# Patient Record
Sex: Male | Born: 2000 | Race: Black or African American | Hispanic: No | Marital: Single | Smoking: Never smoker
Health system: Southern US, Community
[De-identification: ages and names within clinical notes are randomized; demographics above are authoritative.]

## PROBLEM LIST (undated history)

## (undated) DIAGNOSIS — J309 Allergic rhinitis, unspecified: Secondary | ICD-10-CM

## (undated) HISTORY — DX: Allergic rhinitis, unspecified: J30.9

---
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
## 2021-01-28 IMAGING — DX DG ANKLE COMPLETE 3+V*L*
3 series · 3 of 3 positions shown · non-contrast
Comparison: 09/10/2018

CLINICAL DATA: Ankle swelling and pain, basketball injury

EXAM:
LEFT ANKLE COMPLETE - 3+ VIEW

[ankle ap]
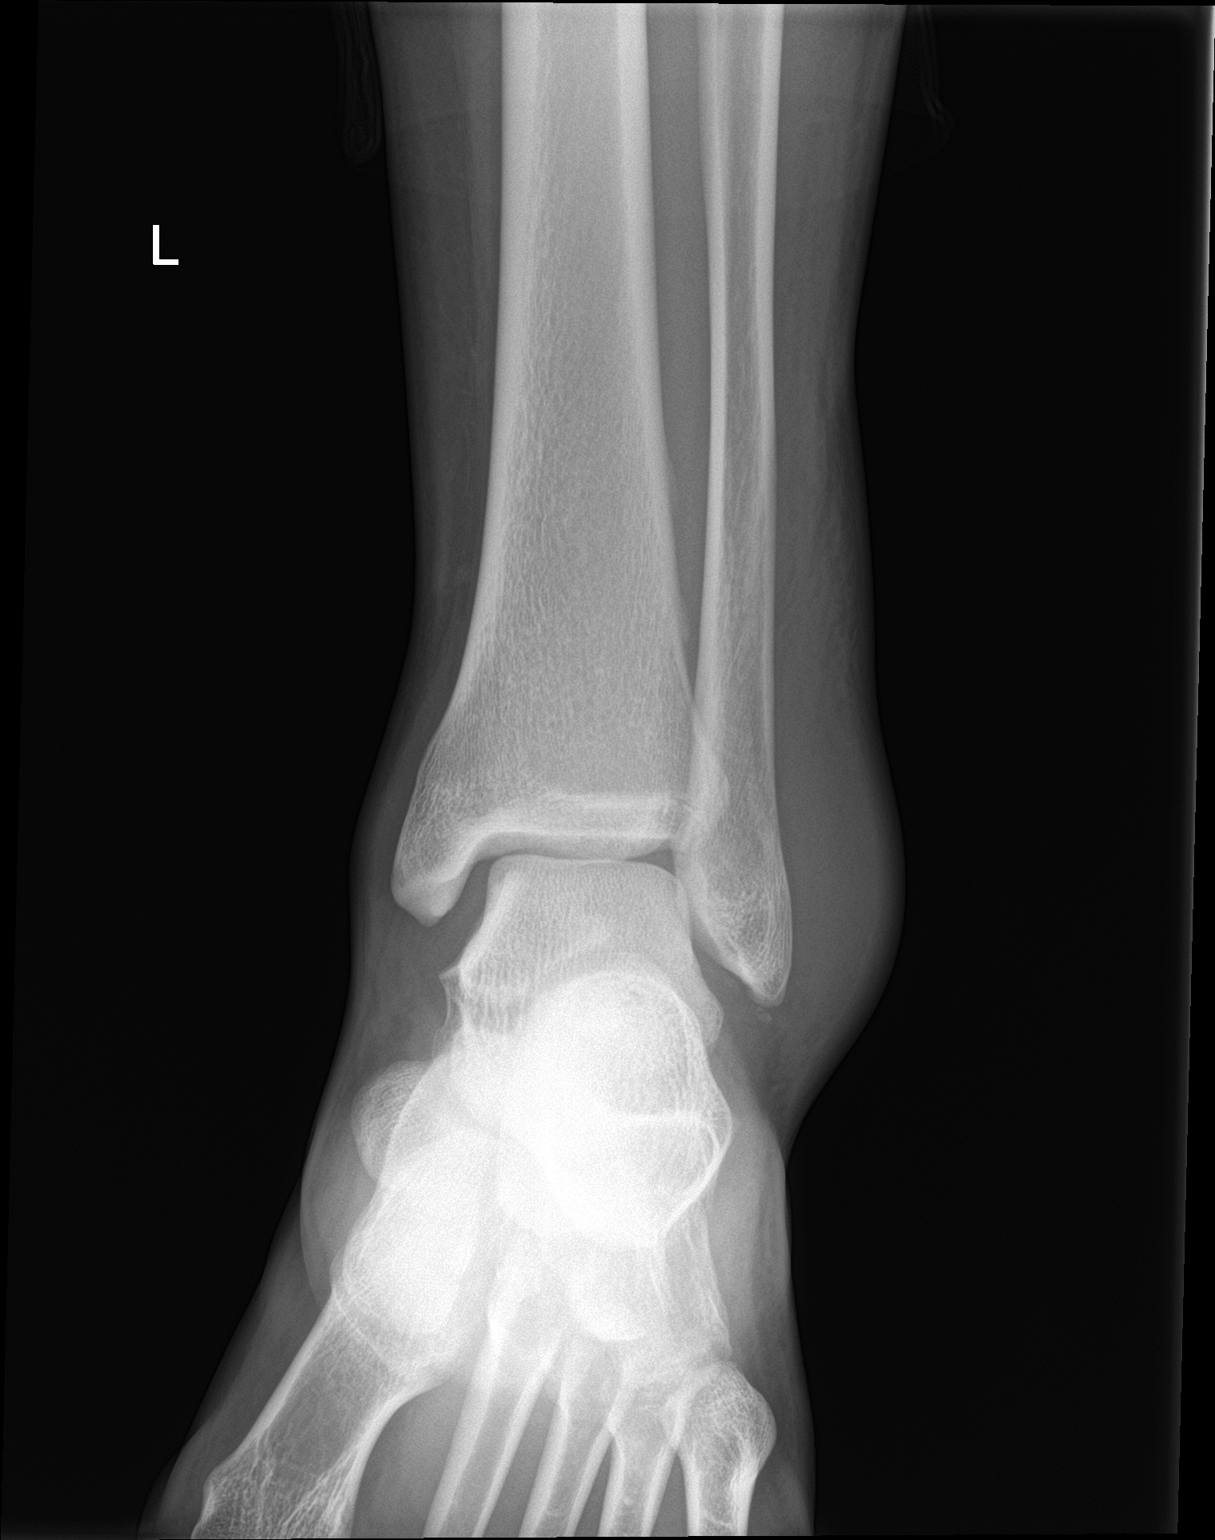

[ankle obl]
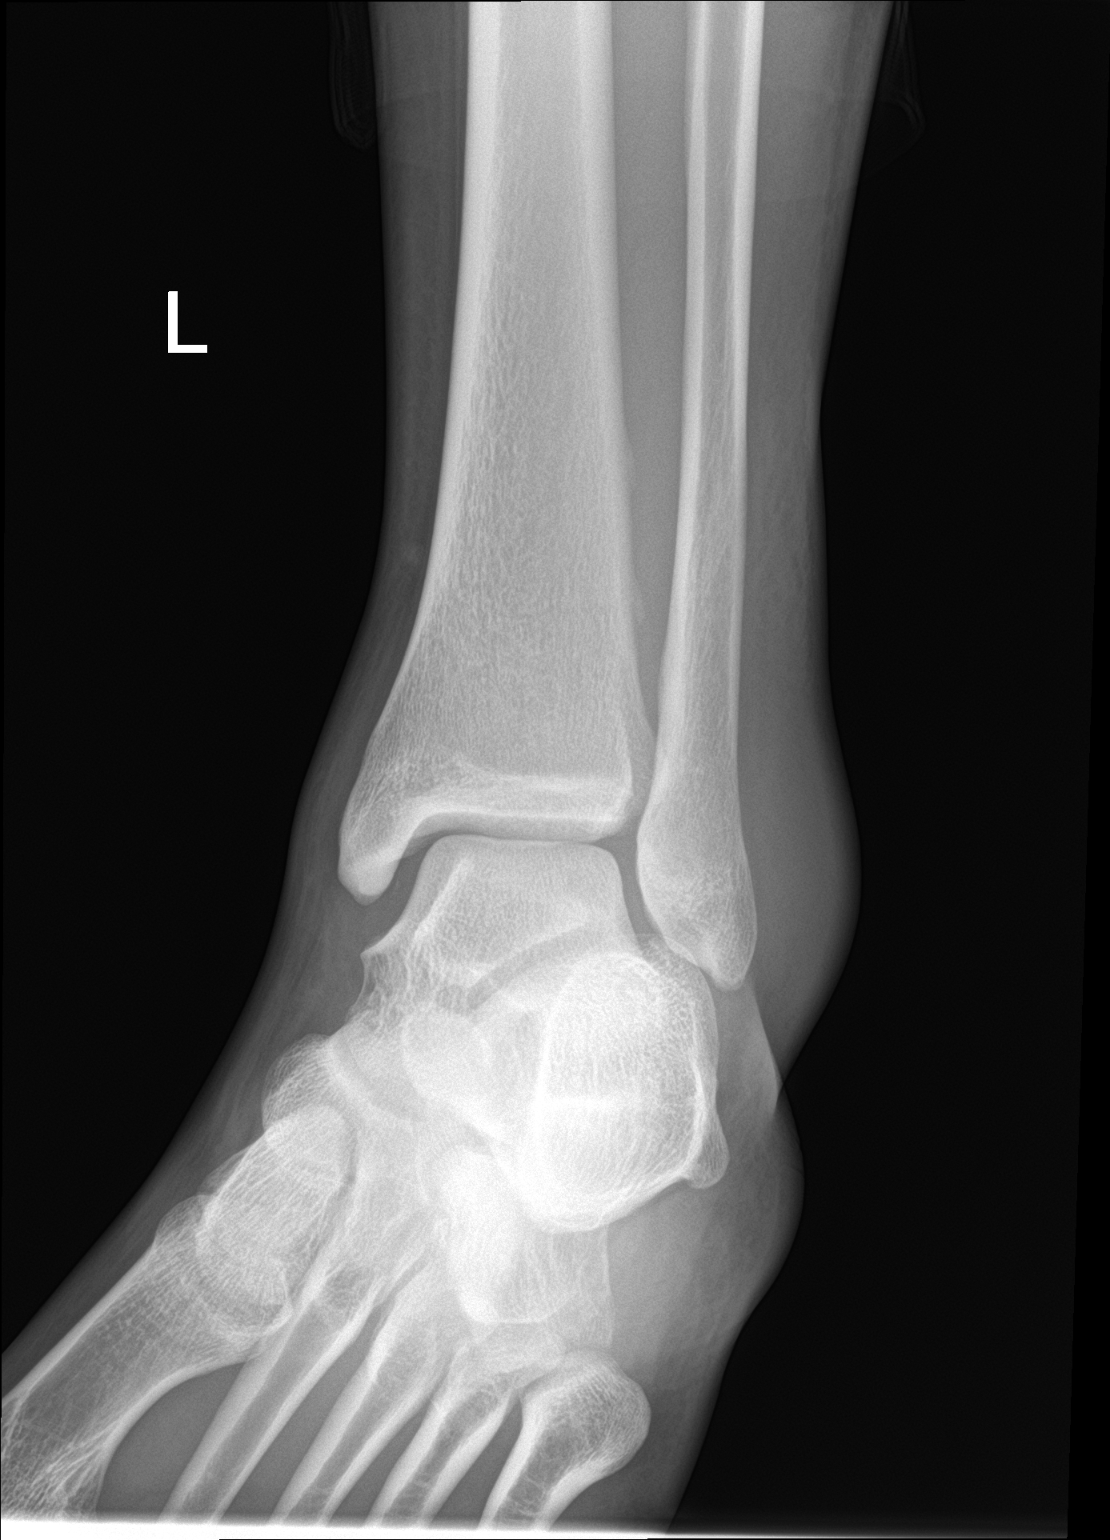

[ankle lat]
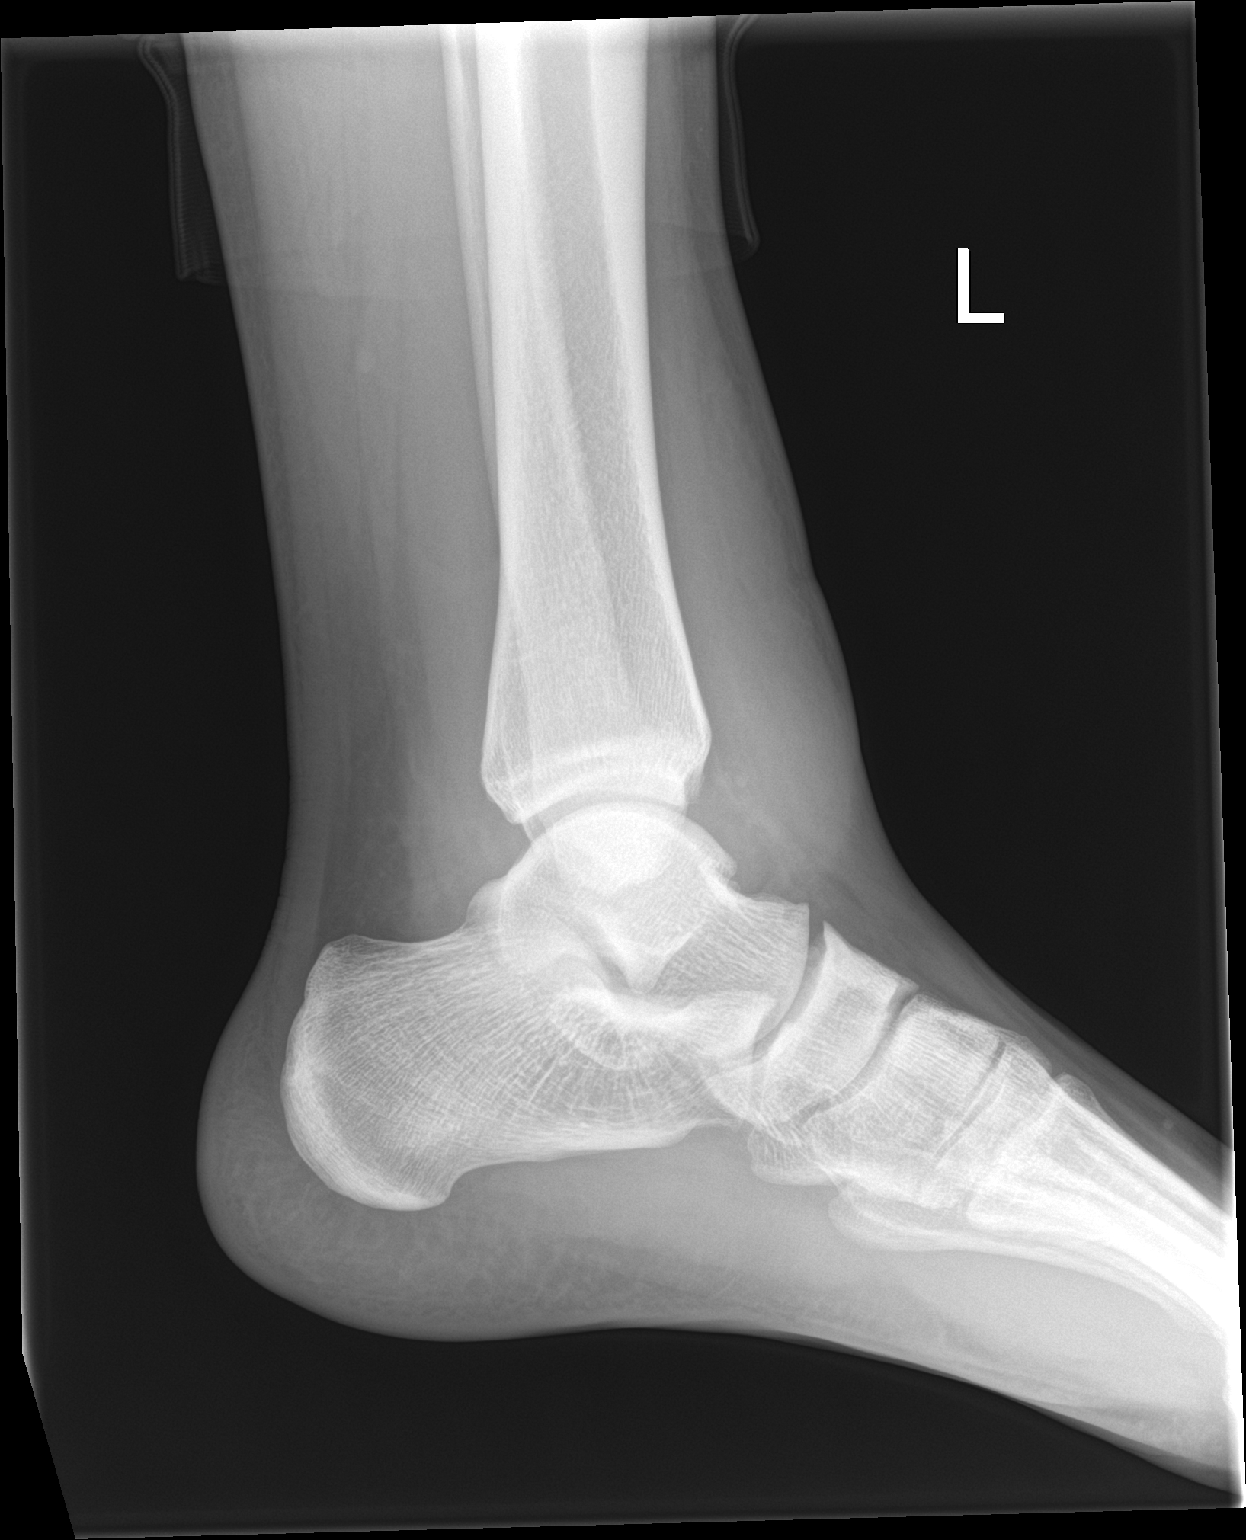

[3 of 3 positions shown; findings below may reference images not displayed]

FINDINGS: Frontal, oblique, and lateral views of the left ankle are obtained.
There is severe anterior and lateral soft tissue edema. No acute
fracture, subluxation, or dislocation. Ankle mortise is intact.
IMPRESSION: 1. Anterolateral soft tissue edema.  No acute bony abnormality.

## 2021-02-23 ENCOUNTER — Telehealth: Payer: Self-pay

## 2021-02-23 NOTE — Telephone Encounter (Signed)
Mother has requested copy of shot records for school  Pt call back [phone]

## 2021-02-24 NOTE — Telephone Encounter (Signed)
Shot record printed and mom is aware. Shot record placed up front for pick up.

## 2021-03-09 ENCOUNTER — Encounter: Payer: Self-pay | Admitting: Family Medicine

## 2021-03-09 ENCOUNTER — Ambulatory Visit: Payer: BC Managed Care – PPO | Admitting: Family Medicine

## 2021-03-09 ENCOUNTER — Other Ambulatory Visit: Payer: Self-pay

## 2021-03-09 VITALS — BP 120/82 | HR 66 | Temp 98.1°F | Wt 218.8 lb

## 2021-03-09 DIAGNOSIS — L209 Atopic dermatitis, unspecified: Secondary | ICD-10-CM

## 2021-03-09 MED ORDER — TRIAMCINOLONE ACETONIDE 0.1 % EX CREA: 1.0000 "application " | TOPICAL_CREAM | Freq: Two times a day (BID) | CUTANEOUS | 2 refills | Status: DC

## 2021-03-09 NOTE — Patient Instructions (Signed)
Atopic Dermatitis Atopic dermatitis is a skin disorder that causes inflammation of the skin. It is marked by a red rash and itchy, dry, scaly skin. It is the most common type of eczema. Eczema is a group of skin conditions that cause the skin to become rough and swollen. This condition is generally worse during the cooler winter months and often improves during the warm summer months. Atopic dermatitis usually starts showing signs in infancy and can last through adulthood. This condition cannot be passed from one person to another (is not contagious). Atopic dermatitis may not always be present, but when it is, it is called a flare-up. What are the causes? The exact cause of this condition is not known. Flare-ups may be triggered by:  Coming in contact with something that you are sensitive or allergic to (allergen).  Stress.  Certain foods.  Extremely hot or cold weather.  Harsh chemicals and soaps.  Dry air.  Chlorine. What increases the risk? This condition is more likely to develop in people who have a personal or family history of:  Eczema.  Allergies.  Asthma.  Hay fever. What are the signs or symptoms? Symptoms of this condition include:  Dry, scaly skin.  Red, itchy rash.  Itchiness, which can be severe. This may occur before the skin rash. This can make sleeping difficult.  Skin thickening and cracking that can occur over time.   How is this diagnosed? This condition is diagnosed based on:  Your symptoms.  Your medical history.  A physical exam. How is this treated? There is no cure for this condition, but symptoms can usually be controlled. Treatment focuses on:  Controlling the itchiness and scratching. You may be given medicines, such as antihistamines or steroid creams.  Limiting exposure to allergens.  Recognizing situations that cause stress and developing a plan to manage stress. If your atopic dermatitis does not get better with medicines, or if  it is all over your body (widespread), a treatment using a specific type of light (phototherapy) may be used. Follow these instructions at home: Skin care  Keep your skin well moisturized. Doing this seals in moisture and helps to prevent dryness. ? Use unscented lotions that have petroleum in them. ? Avoid lotions that contain alcohol or water. They can dry the skin.  Keep baths or showers short (less than 5 minutes) in warm water. Do not use hot water. ? Use mild, unscented cleansers for bathing. Avoid soap and bubble bath. ? Apply a moisturizer to your skin right after a bath or shower.  Do not apply anything to your skin without checking with your health care provider.   General instructions  Take or apply over-the-counter and prescription medicines only as told by your health care provider.  Dress in clothes made of cotton or cotton blends. Dress lightly because heat increases itchiness.  When washing your clothes, rinse your clothes twice so all of the soap is removed.  Avoid any triggers that can cause a flare-up.  Keep your fingernails cut short.  Avoid scratching. Scratching makes the rash and itchiness worse. A break in the skin from scratching could result in a skin infection (impetigo).  Do not be around people who have cold sores or fever blisters. If you get the infection, it may cause your atopic dermatitis to worsen.  Keep all follow-up visits. This is important. Contact a health care provider if:  Your itchiness interferes with sleep.  Your rash gets worse or is not better within   one week of starting treatment.  You have a fever.  You have a rash flare-up after having contact with someone who has cold sores or fever blisters. Get help right away if:  You develop pus or soft yellow scabs in the rash area. Summary  Atopic dermatitis causes a red rash and itchy, dry, scaly skin.  Treatment focuses on controlling the itchiness and scratching, limiting  exposure to things that you are sensitive or allergic to (allergens), recognizing situations that cause stress, and developing a plan to manage stress.  Keep your skin well moisturized.  Keep baths or showers shorter than 5 minutes and use warm water. Do not use hot water. This information is not intended to replace advice given to you by your health care provider. Make sure you discuss any questions you have with your health care provider. Document Revised: 07/27/2020 Document Reviewed: 07/27/2020 Elsevier Patient Education  2021 Elsevier Inc.  

## 2021-03-09 NOTE — Progress Notes (Signed)
Pt has rash on both elbows. Has been there a few days. No itching, burning or stinging. No pain. No treatments tried at this time.     Patient ID: Mark Hickman, male    DOB: June 06, 2001, 20 y.o.   MRN: [id number]   Chief Complaint  Patient presents with  . Rash   Subjective:  OY:DXAJ  This is a chronic problem.  Presents today with a complaint of rash.  Reports that he has always had the same rash in the same location which is on the lateral aspect of both elbows, close to the bend in the elbows.  Reports no itching, no burning, no stinging, no pain.  Reports that he has had this in the past, gets an ointment and it goes away.  He has not tried anything for his symptoms.  Denies fever, chills, chest pain, shortness of breath, denies any new detergents, soaps, foods, outside exposure.    Medical History Mark Hickman has a past medical history of Allergic rhinitis.   Outpatient Encounter Medications as of 03/09/2021  Medication Sig  . triamcinolone cream (KENALOG) 0.1 % Apply 1 application topically 2 (two) times daily.   No facility-administered encounter medications on file as of 03/09/2021.     Review of Systems  Constitutional: Negative for chills and fever.  HENT: Negative for trouble swallowing.   Respiratory: Negative for shortness of breath.   Cardiovascular: Negative for chest pain.  Gastrointestinal: Negative for abdominal pain.  Skin: Positive for rash (raised rash on both arms at bend of elbow).  Neurological: Negative for headaches.     Vitals BP 120/82   Pulse 66   Temp 98.1 F (36.7 C)   Wt 218 lb 12.8 oz (99.2 kg)   SpO2 99%   BMI 30.95 kg/m   Objective:   Physical Exam Vitals reviewed.  Cardiovascular:     Rate and Rhythm: Normal rate and regular rhythm.     Heart sounds: Normal heart sounds.  Pulmonary:     Effort: Pulmonary effort is normal.     Breath sounds: Normal breath sounds.  Abdominal:     General: Bowel sounds are normal.  Skin:    General:  Skin is warm and dry.     Findings: Rash present. No erythema. Rash is papular. Rash is not purpuric.       Neurological:     General: No focal deficit present.     Mental Status: He is alert.  Psychiatric:        Behavior: Behavior normal.      Assessment and Plan   1. Atopic dermatitis, unspecified type - triamcinolone cream (KENALOG) 0.1 %; Apply 1 application topically 2 (two) times daily.  Dispense: 30 g; Refill: 2   Plan: Kenalog ointment twice per day as needed.  He will let us know if this treatment is no longer effective.  Reports that it has been effective in the past.  Agrees with plan of care discussed today. Understands warning signs to seek further care: chest pain, shortness of breath, any significant change in health.  Understands to follow-up if symptoms worsen, do not improve with current treatment.   Mark Guest, NP 03/09/2021

## 2021-03-23 ENCOUNTER — Ambulatory Visit: Payer: BC Managed Care – PPO | Admitting: Family Medicine

## 2021-03-23 ENCOUNTER — Telehealth: Payer: Self-pay | Admitting: Family Medicine

## 2021-03-23 ENCOUNTER — Encounter: Payer: Self-pay | Admitting: Family Medicine

## 2021-03-23 ENCOUNTER — Other Ambulatory Visit: Payer: Self-pay

## 2021-03-23 VITALS — BP 129/72 | HR 53 | Temp 98.1°F | Ht 70.59 in | Wt 222.0 lb

## 2021-03-23 DIAGNOSIS — D1801 Hemangioma of skin and subcutaneous tissue: Secondary | ICD-10-CM | POA: Diagnosis not present

## 2021-03-23 NOTE — Telephone Encounter (Signed)
Patient was seen this morning and spoke with his mother. He wants to go ahead with referral to have it removed. Please advise

## 2021-03-23 NOTE — Progress Notes (Signed)
   Patient ID: Mark Hickman, male    DOB: 11-19-2000, 20 y.o.   MRN: [id number]   Chief Complaint  Patient presents with  . bump on abdomen    X 1 week     Subjective:    HPI Bump on left abdomen x 1 week.  Very small bump.  No pain,  No drainage.  Only bled once when scratched it.  Not getting bigger.  No family history with skin cancer.    Medical History Mark Hickman has a past medical history of Allergic rhinitis.   Outpatient Encounter Medications as of 03/23/2021  Medication Sig  . triamcinolone cream (KENALOG) 0.1 % Apply 1 application topically 2 (two) times daily. (Patient not taking: Reported on 03/23/2021)   No facility-administered encounter medications on file as of 03/23/2021.     Review of Systems  Constitutional: Negative for chills and fever.  HENT: Negative for congestion, rhinorrhea and sore throat.   Respiratory: Negative for cough, shortness of breath and wheezing.   Cardiovascular: Negative for chest pain and leg swelling.  Gastrointestinal: Negative for abdominal pain, diarrhea, nausea and vomiting.  Genitourinary: Negative for dysuria and frequency.  Skin: Negative for rash.       +small bump on left torso.  Neurological: Negative for dizziness, weakness and headaches.     Vitals BP 129/72   Pulse (!) 53   Temp 98.1 F (36.7 C)   Ht 5' 10.59" (1.793 m)   Wt 222 lb (100.7 kg)   SpO2 99%   BMI 31.32 kg/m   Objective:   Physical Exam  0.5cm erythematous lesion, appears dark red/purple, on left torso near left abdomen/flank. No bleeding. No drainage  No tenderness.  Assessment and Plan   1. Cherry angioma   Benign likely, but gave pt option, wants to talk with his mom and call back if wanting it removed. Offered to remove it today, pt declining for now.  Gave handout on cherry angioma.  Pt will call or rto if wanting to have it removed or if getting bigger and causing irritation.   Return if symptoms worsen or fail to  improve.   03/24/2021

## 2021-03-23 NOTE — Telephone Encounter (Signed)
Pls give dermatology referral.   Thx.   Dr. Lovena Le

## 2021-03-23 NOTE — Telephone Encounter (Signed)
Referral placed and pt is aware. 

## 2021-03-25 ENCOUNTER — Telehealth: Payer: Self-pay | Admitting: Dermatology

## 2021-03-25 NOTE — Telephone Encounter (Signed)
Patient's mom called, doesn't want to wait until November. SHE will call referrer + see if he can find someplace else sooner

## 2021-03-25 NOTE — Telephone Encounter (Signed)
Notes documented in referral and routed referral back to referring office.

## 2021-05-04 DIAGNOSIS — L700 Acne vulgaris: Secondary | ICD-10-CM | POA: Diagnosis not present

## 2021-05-04 DIAGNOSIS — L980 Pyogenic granuloma: Secondary | ICD-10-CM | POA: Diagnosis not present

## 2022-03-31 DIAGNOSIS — L209 Atopic dermatitis, unspecified: Secondary | ICD-10-CM | POA: Diagnosis not present

## 2022-05-02 ENCOUNTER — Ambulatory Visit: Payer: BC Managed Care – PPO | Admitting: Family Medicine

## 2022-05-02 DIAGNOSIS — K5900 Constipation, unspecified: Secondary | ICD-10-CM

## 2022-05-02 MED ORDER — MAGNESIUM CITRATE PO SOLN: 1.0000 | Freq: Once | ORAL | 0 refills | Status: AC

## 2022-05-02 NOTE — Progress Notes (Signed)
Subjective:  Patient ID: Mark Hickman, male    DOB: [birth year]  Age: 21 y.o. MRN: [id number]  CC: Chief Complaint  Patient presents with   Constipation    Last bm was on Wednesday of last week.     HPI:  21 year old male presents for evaluation of the above.  Patient states that he has not had a bowel movement since Tuesday or Wednesday of last week.  He states that he has some discomfort with the tries to have a bowel movement.  No abdominal pain.  He has tried stool softener and 1 dose of MiraLAX without resolution.  He states that he eats a poor diet with mostly fried and fatty foods.  No fever.  No other associated symptoms.  No other complaints.  Patient Active Problem List   Diagnosis Date Noted   Constipation 05/02/2022    Social Hx   Social History   Socioeconomic History   Marital status: Single    Spouse name: Not on file   Number of children: Not on file   Years of education: Not on file   Highest education level: Not on file  Occupational History   Not on file  Tobacco Use   Smoking status: Never   Smokeless tobacco: Never  Substance and Sexual Activity   Alcohol use: No   Drug use: No   Sexual activity: Never  Other Topics Concern   Not on file  Social History Narrative   Not on file   Social Determinants of Health   Financial Resource Strain: Not on file  Food Insecurity: Not on file  Transportation Needs: Not on file  Physical Activity: Not on file  Stress: Not on file  Social Connections: Not on file    Review of Systems Per HPI  Objective:  BP 114/74   Pulse 60   Temp 98.6 F (37 C) (Oral)   Ht 5' 10.72" (1.796 m)   Wt 218 lb 9.6 oz (99.2 kg)   SpO2 98%   BMI 30.73 kg/m      05/02/2022   11:35 AM 03/23/2021   11:02 AM 03/09/2021   10:54 AM  BP/Weight  Systolic BP 188 416 606  Diastolic BP 74 72 82  Wt. (Lbs) 218.6 222 218.8  BMI 30.73 kg/m2 31.32 kg/m2 30.95 kg/m2    Physical Exam Vitals and nursing note reviewed.   Constitutional:      General: He is not in acute distress.    Appearance: Normal appearance. He is not ill-appearing.  HENT:     Head: Normocephalic and atraumatic.  Cardiovascular:     Rate and Rhythm: Normal rate and regular rhythm.  Pulmonary:     Effort: Pulmonary effort is normal.     Breath sounds: Normal breath sounds.  Abdominal:     General: There is no distension.     Palpations: Abdomen is soft.     Tenderness: There is no abdominal tenderness.  Neurological:     Mental Status: He is alert.     Lab Results  Component Value Date   WBC 7.3 07/17/2015   HGB 14.3 07/17/2015   HCT 41.0 07/17/2015   PLT 296 07/17/2015   GLUCOSE 87 07/17/2015   NA 137 07/17/2015   K 5.0 07/17/2015   CL 99 07/17/2015   CREATININE 0.74 07/17/2015   BUN 13 07/17/2015   CO2 26 07/17/2015     Assessment & Plan:   Problem List Items Addressed This Visit  Other   Constipation    Mag citrate x1.  Advised MiraLAX 1-2 times daily.  Advised dietary changes.       Meds ordered this encounter  Medications   magnesium citrate SOLN    Sig: Take 296 mLs (1 Bottle total) by mouth once for 1 dose.    Dispense:  296 mL    Refill:  [place]

## 2022-05-02 NOTE — Assessment & Plan Note (Addendum)
Mag citrate x1.  Advised MiraLAX 1-2 times daily.  Advised dietary changes.

## 2022-05-02 NOTE — Patient Instructions (Signed)
Mag Citrate today. This should produce a bowel movement soon.  Afterwards, recommend Miralax 1-2 times daily.  Watch diet.  Take care  Dr. Lacinda Axon

## 2022-09-08 ENCOUNTER — Encounter: Payer: Self-pay | Admitting: Nurse Practitioner

## 2022-09-08 ENCOUNTER — Ambulatory Visit (INDEPENDENT_AMBULATORY_CARE_PROVIDER_SITE_OTHER): Payer: BC Managed Care – PPO | Admitting: Nurse Practitioner

## 2022-09-08 VITALS — BP 118/76 | Temp 98.2°F | Ht 70.0 in | Wt 222.0 lb

## 2022-09-08 DIAGNOSIS — J329 Chronic sinusitis, unspecified: Secondary | ICD-10-CM | POA: Diagnosis not present

## 2022-09-08 LAB — POCT RAPID STREP A (OFFICE): Rapid Strep A Screen: NEGATIVE

## 2022-09-08 MED ORDER — AMOXICILLIN 875 MG PO TABS: 875.0000 mg | ORAL_TABLET | Freq: Two times a day (BID) | ORAL | 0 refills | Status: AC

## 2022-09-08 NOTE — Progress Notes (Signed)
   Subjective:    Patient ID: Mark Hickman, male    DOB: 2001-04-20, 21 y.o.   MRN: [id number]  Cough This is a new problem. The current episode started in the past 7 days. Associated symptoms include nasal congestion and a sore throat.   21 year old male patient presents to clinic today with sinus congestion for the past 4 to 5 days.  Patient also admits to sore throat, loss body aches, productive cough with green sputum, nasal discharge that is green.  Patient denies any fevers, chills, ear pain, headaches, shortness of breath or difficulty breathing.   Review of Systems  HENT:  Positive for sore throat.   Respiratory:  Positive for cough.        Objective:   Physical Exam Vitals reviewed.  Constitutional:      General: He is not in acute distress.    Appearance: Normal appearance. He is normal weight. He is not ill-appearing, toxic-appearing or diaphoretic.  HENT:     Head: Normocephalic and atraumatic.     Right Ear: Tympanic membrane, ear canal and external ear normal.     Left Ear: Tympanic membrane, ear canal and external ear normal.     Nose: Congestion and rhinorrhea present.     Mouth/Throat:     Mouth: Mucous membranes are moist.     Pharynx: Posterior oropharyngeal erythema present. No oropharyngeal exudate.  Eyes:     Extraocular Movements: Extraocular movements intact.     Conjunctiva/sclera: Conjunctivae normal.     Pupils: Pupils are equal, round, and reactive to light.  Cardiovascular:     Rate and Rhythm: Normal rate and regular rhythm.     Pulses: Normal pulses.     Heart sounds: Normal heart sounds. No murmur heard. Pulmonary:     Effort: Pulmonary effort is normal. No respiratory distress.     Breath sounds: Normal breath sounds. No wheezing.  Musculoskeletal:     Cervical back: Normal range of motion and neck supple. No rigidity or tenderness.     Comments: Grossly intact  Lymphadenopathy:     Cervical: Cervical adenopathy present.  Skin:     General: Skin is warm.     Capillary Refill: Capillary refill takes less than 2 seconds.  Neurological:     Mental Status: He is alert.     Comments: Grossly intact  Psychiatric:        Mood and Affect: Mood normal.        Behavior: Behavior normal.           Assessment & Plan:   1. Rhinosinusitis - Likely sinus infection.  - amoxicillin (AMOXIL) 875 MG tablet; Take 1 tablet (875 mg total) by mouth 2 (two) times daily for 10 days.  Dispense: 20 tablet; Refill: 0 - POCT rapid strep A - COVID-19, Flu A+B and RSV - RTC if symptoms do not improve.     Note:  This document was prepared using Dragon voice recognition software and may include unintentional dictation errors. Note - This record has been created using [employer].  Chart creation errors have been sought, but may not always  have been located. Such creation errors do not reflect on  the standard of medical care.

## 2022-09-09 ENCOUNTER — Other Ambulatory Visit: Payer: Self-pay | Admitting: Family Medicine

## 2022-09-09 ENCOUNTER — Encounter: Payer: Self-pay | Admitting: Nurse Practitioner

## 2022-09-09 LAB — COVID-19, FLU A+B AND RSV
Influenza A, NAA: NOT DETECTED
Influenza B, NAA: NOT DETECTED
RSV, NAA: NOT DETECTED
SARS-CoV-2, NAA: NOT DETECTED

## 2022-09-09 LAB — SPECIMEN STATUS REPORT

## 2022-09-09 MED ORDER — PROMETHAZINE-DM 6.25-15 MG/5ML PO SYRP: 5.0000 mL | ORAL_SOLUTION | Freq: Four times a day (QID) | ORAL | 0 refills | Status: AC | PRN

## 2023-03-02 DIAGNOSIS — I498 Other specified cardiac arrhythmias: Secondary | ICD-10-CM | POA: Diagnosis not present

## 2023-03-02 DIAGNOSIS — J329 Chronic sinusitis, unspecified: Secondary | ICD-10-CM | POA: Diagnosis not present

## 2023-03-02 DIAGNOSIS — R0789 Other chest pain: Secondary | ICD-10-CM | POA: Diagnosis not present

## 2023-03-02 DIAGNOSIS — J40 Bronchitis, not specified as acute or chronic: Secondary | ICD-10-CM | POA: Diagnosis not present
